# Patient Record
Sex: Male | Born: 1960 | Race: White | Hispanic: No | Marital: Married | Smoking: Never smoker
Health system: Southern US, Community
[De-identification: ages and names within clinical notes are randomized; demographics above are authoritative.]

## PROBLEM LIST (undated history)

## (undated) ENCOUNTER — Emergency Department: Discharge status: Absent without leave | Payer: 59

## (undated) DIAGNOSIS — K644 Residual hemorrhoidal skin tags: Secondary | ICD-10-CM

## (undated) DIAGNOSIS — K5790 Diverticulosis of intestine, part unspecified, without perforation or abscess without bleeding: Secondary | ICD-10-CM

## (undated) DIAGNOSIS — F909 Attention-deficit hyperactivity disorder, unspecified type: Secondary | ICD-10-CM

## (undated) DIAGNOSIS — D126 Benign neoplasm of colon, unspecified: Secondary | ICD-10-CM

## (undated) DIAGNOSIS — C801 Malignant (primary) neoplasm, unspecified: Secondary | ICD-10-CM

## (undated) DIAGNOSIS — Z85831 Personal history of malignant neoplasm of soft tissue: Secondary | ICD-10-CM

## (undated) DIAGNOSIS — H8109 Meniere's disease, unspecified ear: Secondary | ICD-10-CM

## (undated) HISTORY — PX: OTHER SURGICAL HISTORY: SHX169

## (undated) HISTORY — PX: COLONOSCOPY: SHX174

---

## 2008-09-10 ENCOUNTER — Emergency Department: Payer: Self-pay | Admitting: Emergency Medicine

## 2008-09-10 ENCOUNTER — Other Ambulatory Visit: Payer: Self-pay

## 2008-11-22 ENCOUNTER — Ambulatory Visit: Payer: Self-pay

## 2011-10-12 ENCOUNTER — Ambulatory Visit: Payer: Self-pay | Admitting: Unknown Physician Specialty

## 2015-10-10 ENCOUNTER — Other Ambulatory Visit: Payer: Self-pay | Admitting: Unknown Physician Specialty

## 2015-10-10 DIAGNOSIS — H905 Unspecified sensorineural hearing loss: Secondary | ICD-10-CM

## 2015-10-23 ENCOUNTER — Ambulatory Visit: Payer: Self-pay

## 2016-02-01 ENCOUNTER — Other Ambulatory Visit: Payer: Self-pay | Admitting: Otolaryngology

## 2016-02-01 DIAGNOSIS — IMO0001 Reserved for inherently not codable concepts without codable children: Secondary | ICD-10-CM

## 2016-02-01 DIAGNOSIS — H9041 Sensorineural hearing loss, unilateral, right ear, with unrestricted hearing on the contralateral side: Secondary | ICD-10-CM

## 2016-02-07 ENCOUNTER — Ambulatory Visit
Admission: RE | Admit: 2016-02-07 | Discharge: 2016-02-07 | Disposition: A | Discharge status: Home / Self Care | Payer: 59 | Source: Ambulatory Visit | Attending: Otolaryngology | Admitting: Otolaryngology

## 2016-02-07 DIAGNOSIS — H9041 Sensorineural hearing loss, unilateral, right ear, with unrestricted hearing on the contralateral side: Secondary | ICD-10-CM

## 2016-02-07 DIAGNOSIS — IMO0001 Reserved for inherently not codable concepts without codable children: Secondary | ICD-10-CM

## 2016-02-07 MED ORDER — GADOBENATE DIMEGLUMINE 529 MG/ML IV SOLN
15.0000 mL | Freq: Once | INTRAVENOUS | Status: AC | PRN
  Administered 2016-02-07: 15 mL via INTRAVENOUS

## 2018-08-23 IMAGING — MR MR HEAD WO/W CM
11 series · 39 of 48 positions shown · IV contrast (multihance)
Comparison: 10/12/2011

CLINICAL DATA: Hearing loss on the right with occasional vertigo.

EXAM:
MRI HEAD WITHOUT AND WITH CONTRAST
TECHNIQUE: Multiplanar, multiecho pulse sequences of the brain and surrounding
structures were obtained without and with intravenous contrast.
CONTRAST:  15mL MULTIHANCE GADOBENATE DIMEGLUMINE 529 MG/ML IV SOLN

[Series 2: T1 · sagittal · 5.0mm · 0.45mm/px · 4 of 21 slices shown (1 of 3)]
[im 1/21]
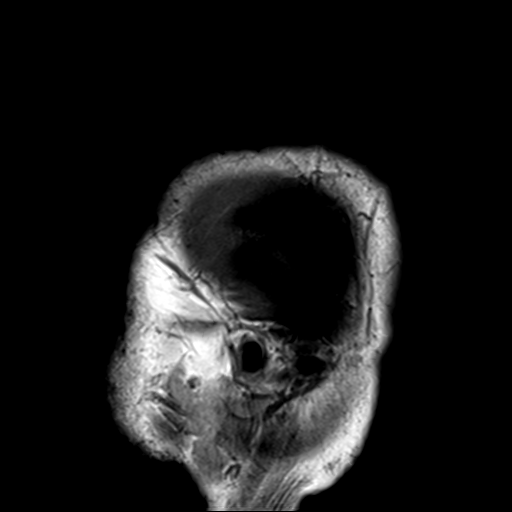
[im 7/21]
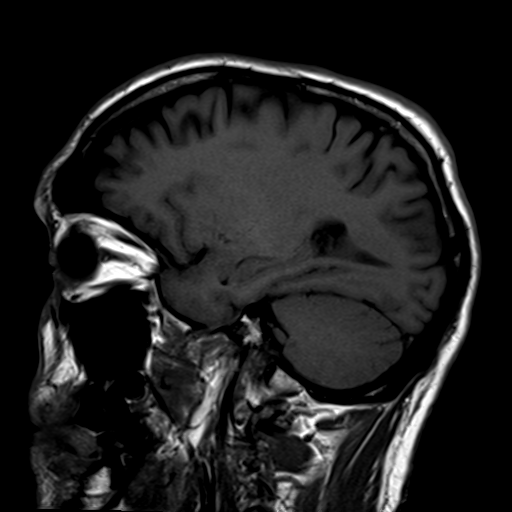
[im 14/21]
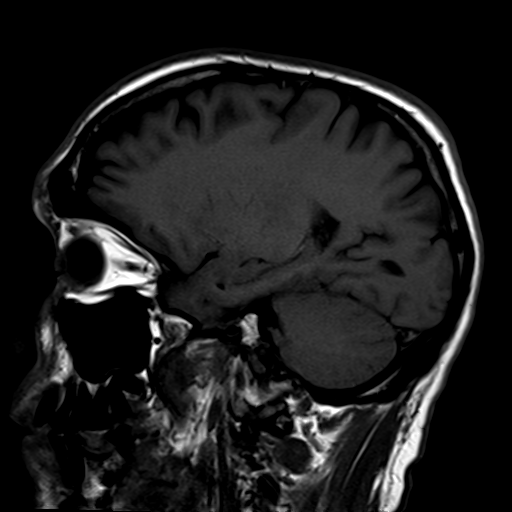
[im 21/21]
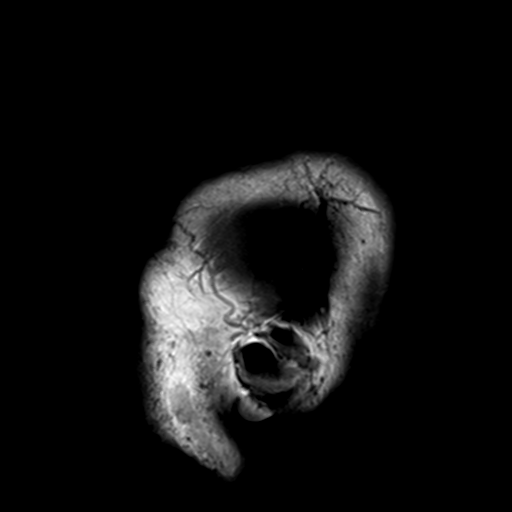

[Series 3: DWI · axial · 3.0mm · 1.80mm/px · z∈[-38,+106]mm · 8 of 100 slices shown (1 of 2)]
[im 1/100]
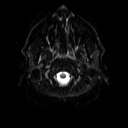
[im 16/100]
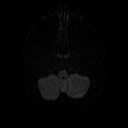
[im 31/100]
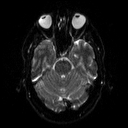
[im 46/100]
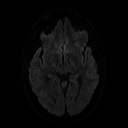
[im 54/100]
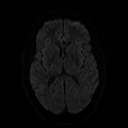
[im 69/100]
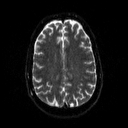
[im 84/100]
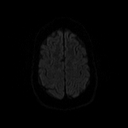
[im 100/100]
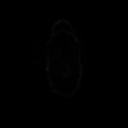

[Series 4: DWI · axial · 3.0mm · 1.80mm/px · z∈[-38,+106]mm · 6 of 47 slices shown (2 of 2)]
[im 1/47]
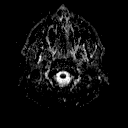
[im 10/47]
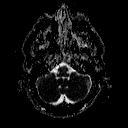
[im 19/47]
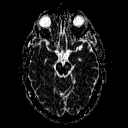
[im 28/47]
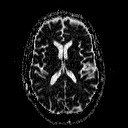
[im 37/47]
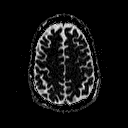
[im 47/47]
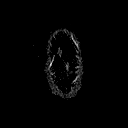

[Series 5: T2 · axial · 5.0mm · 0.60mm/px · z∈[-37,+104]mm · 3 of 23 slices shown]
[im 1/23]
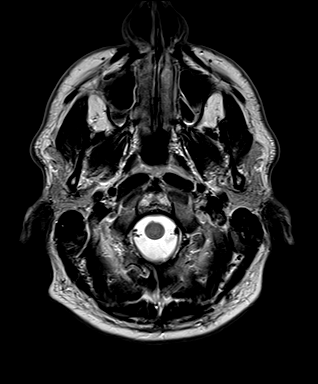
[im 12/23]
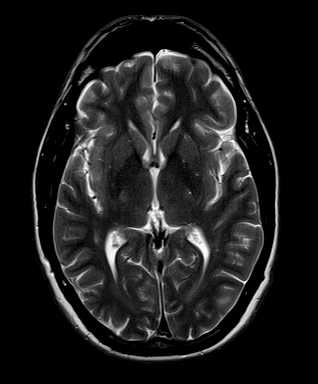
[im 23/23]
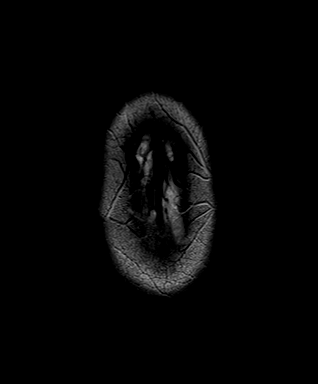

[Series 6: FLAIR · axial · 5.0mm · 0.45mm/px · z∈[-37,+103]mm · 3 of 23 slices shown]
[im 1/23]
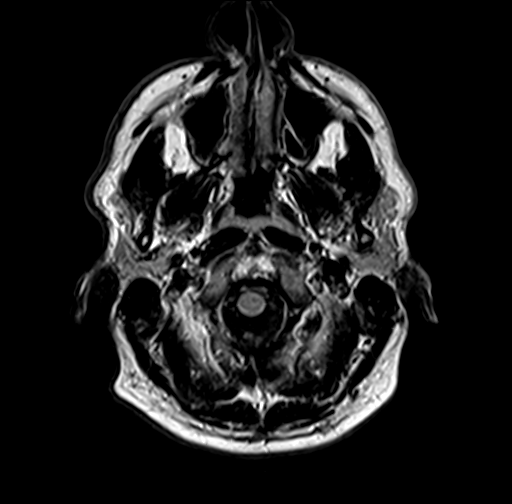
[im 12/23]
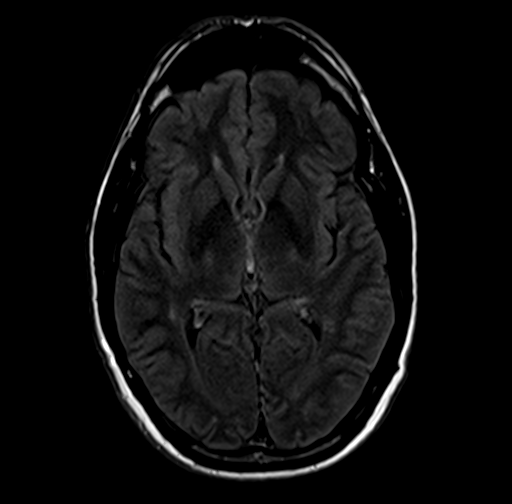
[im 23/23]
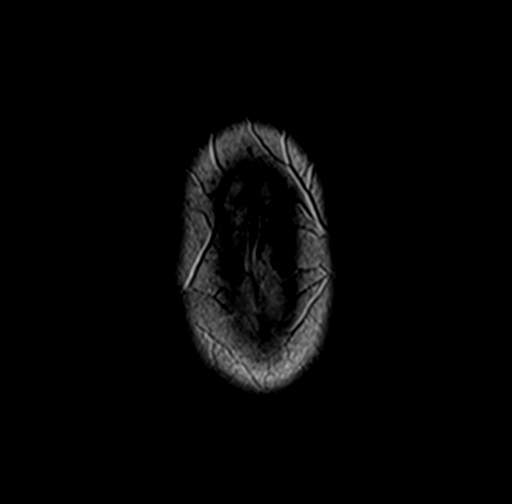

[Series 7: T1 · coronal · 3.0mm · 0.35mm/px · 1 of 11 slices shown (2 of 3)]
[im 1/11]
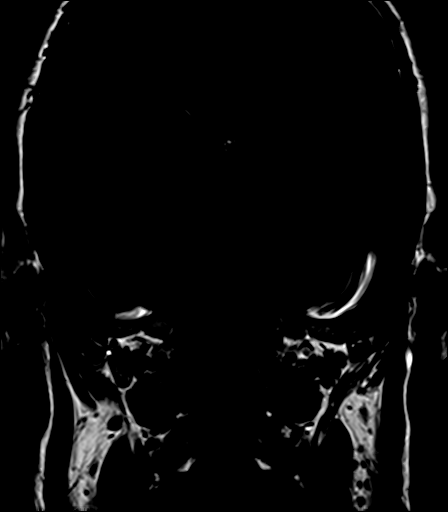

[Series 8: T1 · axial · 3.0mm · 0.35mm/px · 1 of 11 slices shown (3 of 3)]
[im 1/11]
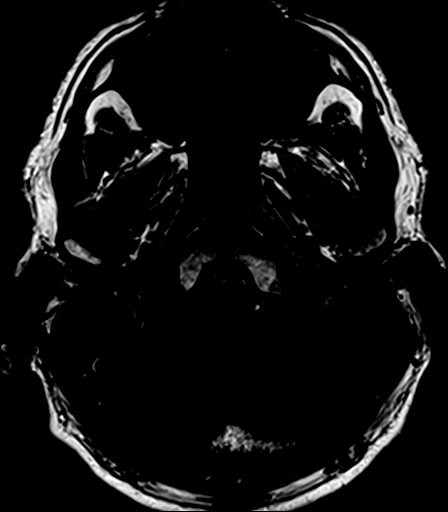

[Series 9: bSSFP · axial · 1.0mm · 0.56mm/px · z∈[-25,+10]mm · 5 of 36 slices shown]
[im 1/36]
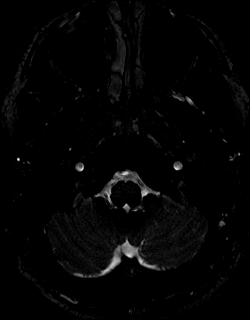
[im 9/36]
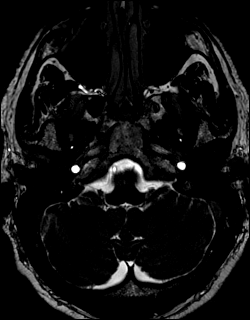
[im 18/36]
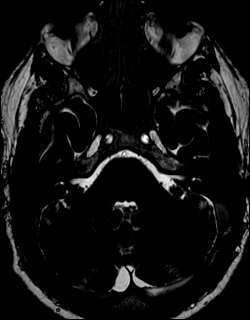
[im 27/36]
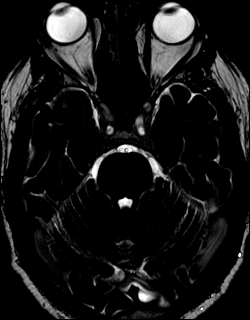
[im 36/36]
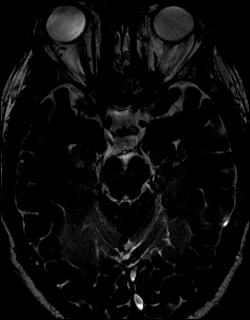

[Series 10: T1 post-contrast · coronal · 3.0mm · 0.35mm/px · 1 of 11 slices shown (1 of 2)]
[im 1/11]
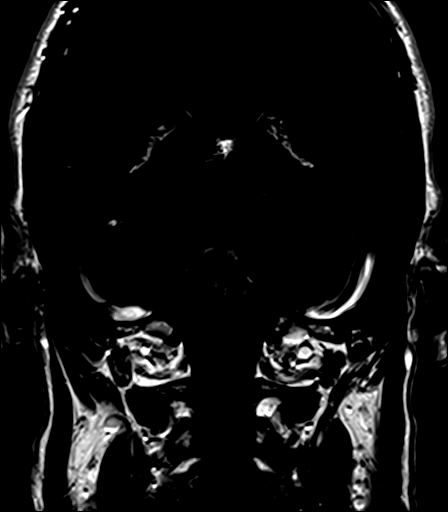

[Series 11: T1 post-contrast · axial · 3.0mm · 0.35mm/px · 1 of 11 slices shown (2 of 2)]
[im 1/11]
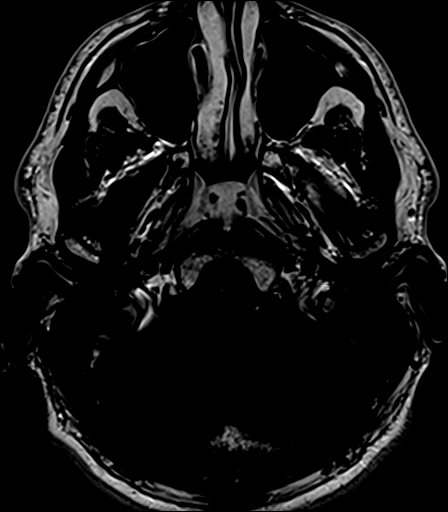

[Series 12: post_t1_mpr_tra · axial · 2.0mm · 0.45mm/px · z∈[-36,+68]mm · 6 of 72 slices shown]
[im 1/72]
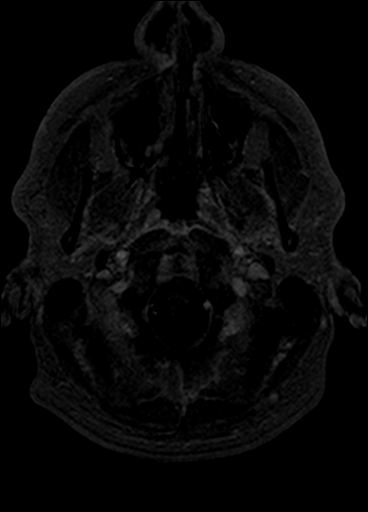
[im 9/72]
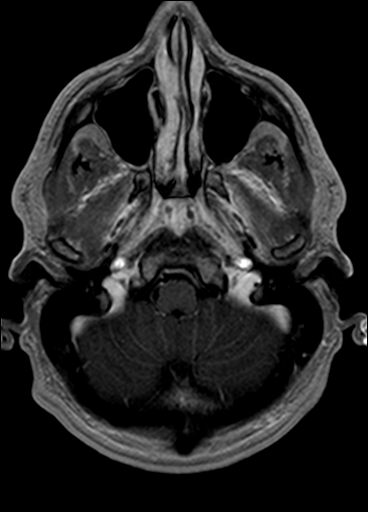
[im 18/72]
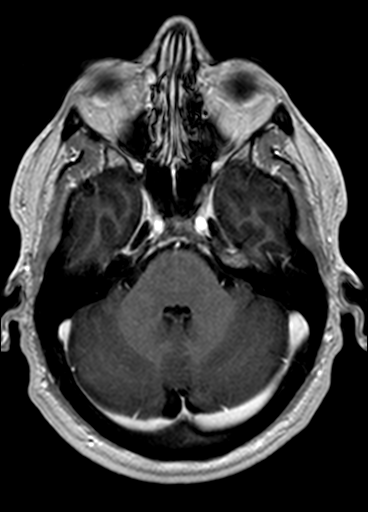
[im 27/72]
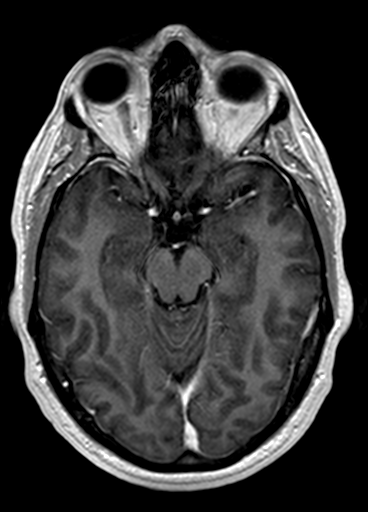
[im 45/72]
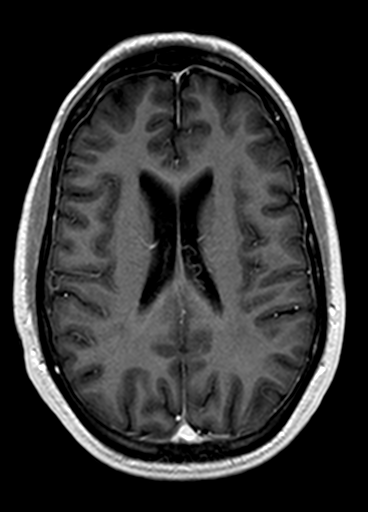
[im 54/72]
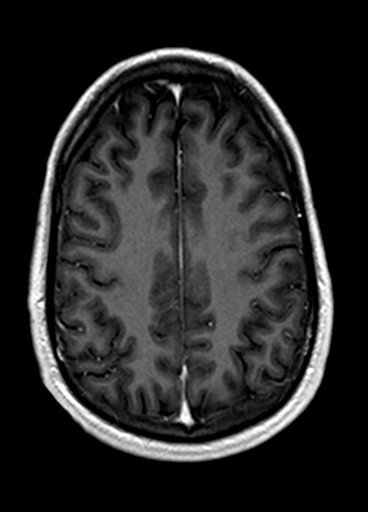

[39 of 48 positions shown; findings below may reference images not displayed]

FINDINGS: Brain: Diffusion imaging does not show any acute or subacute
infarction. The brainstem and cerebellum are normal. CP angle
regions are normal. Seventh and eighth nerve complexes are normal.
No vestibular schwannoma. No enhancing neuritis. Cerebral
hemispheres are normal except for a few scattered foci of T2 and
FLAIR signal in the white matter, not likely of clinical
significance. No cortical abnormality. No mass lesion, hemorrhage,
hydrocephalus or extra-axial collection. No abnormal contrast
enhancement.

Vascular: Major vessels at the base of the brain show flow.

Skull and upper cervical spine: Normal

Sinuses/Orbits: Clear/normal

Other: None significant
IMPRESSION: Negative examination. No cause of the presenting symptoms is
identified. No evidence of vestibular schwannoma or enhancing
neuritis.

Normal appearance of the brain for age.

## 2021-06-29 ENCOUNTER — Other Ambulatory Visit: Payer: Self-pay | Admitting: Internal Medicine

## 2021-07-04 LAB — SURGICAL PATHOLOGY

## 2021-09-28 ENCOUNTER — Other Ambulatory Visit: Payer: Self-pay | Admitting: Sports Medicine

## 2021-09-28 DIAGNOSIS — W11XXXA Fall on and from ladder, initial encounter: Secondary | ICD-10-CM

## 2021-10-12 ENCOUNTER — Ambulatory Visit
Admission: RE | Admit: 2021-10-12 | Discharge: 2021-10-12 | Disposition: A | Discharge status: Home / Self Care | Payer: Managed Care, Other (non HMO) | Source: Ambulatory Visit | Attending: Sports Medicine | Admitting: Sports Medicine

## 2021-10-12 DIAGNOSIS — W11XXXA Fall on and from ladder, initial encounter: Secondary | ICD-10-CM

## 2021-10-17 ENCOUNTER — Other Ambulatory Visit: Payer: Self-pay | Admitting: Orthopedic Surgery

## 2021-10-22 ENCOUNTER — Other Ambulatory Visit: Payer: Self-pay

## 2021-10-22 ENCOUNTER — Encounter
Admission: RE | Admit: 2021-10-22 | Discharge: 2021-10-22 | Disposition: A | Discharge status: Home / Self Care | Payer: Managed Care, Other (non HMO) | Source: Ambulatory Visit | Attending: Orthopedic Surgery | Admitting: Orthopedic Surgery

## 2021-10-22 HISTORY — DX: Meniere's disease, unspecified ear: H81.09

## 2021-10-22 HISTORY — DX: Personal history of malignant neoplasm of soft tissue: Z85.831

## 2021-10-22 HISTORY — DX: Diverticulosis of intestine, part unspecified, without perforation or abscess without bleeding: K57.90

## 2021-10-22 HISTORY — DX: Residual hemorrhoidal skin tags: K64.4

## 2021-10-22 HISTORY — DX: Attention-deficit hyperactivity disorder, unspecified type: F90.9

## 2021-10-22 HISTORY — DX: Malignant (primary) neoplasm, unspecified: C80.1

## 2021-10-22 HISTORY — DX: Benign neoplasm of colon, unspecified: D12.6

## 2021-10-22 NOTE — Pre-Procedure Instructions (Signed)
Patient not available, Wife Jesse Jennings assisted in pre admission call, instructions reviewed with her.

## 2021-10-22 NOTE — Patient Instructions (Signed)
Your procedure is scheduled on: 10/23/21 - Tuesday Report to the Registration Desk on the 1st floor of the [facility]. To find out your arrival time, please call [phone] between 1PM - 3PM on: 10/22/21 - Monday If your arrival time is 6:00 am, do not arrive prior to that time as the [facility] entrance doors do not open until 6:00 am.  REMEMBER: Instructions that are not followed completely may result in serious medical risk, up to and including death; or upon the discretion of your surgeon and anesthesiologist your surgery may need to be rescheduled.  Do not eat food after midnight the night before surgery.  No gum chewing, lozengers or hard candies.  You may however, drink CLEAR liquids up to 2 hours before you are scheduled to arrive for your surgery. Do not drink anything within 2 hours of your scheduled arrival time.  Clear liquids include: - water  - apple juice without pulp - gatorade (not RED colors) - black coffee or tea (Do NOT add milk or creamers to the coffee or tea) Do NOT drink anything that is not on this list.  TAKE THESE MEDICATIONS THE MORNING OF SURGERY WITH A SIP OF WATER: - NONE  One week prior to surgery: Stop Anti-inflammatories (NSAIDS) such as Advil, Aleve, Ibuprofen, Motrin, Naproxen, Naprosyn and Aspirin based products such as Excedrin, Goodys Powder, BC Powder.  Stop ANY OVER THE COUNTER supplements until after surgery.  You may however, continue to take Tylenol if needed for pain up until the day of surgery.  No Alcohol for 24 hours before or after surgery.  No Smoking including e-cigarettes for 24 hours prior to surgery.  No chewable tobacco products for at least 6 hours prior to surgery.  No nicotine patches on the day of surgery.  Do not use any "recreational" drugs for at least a week prior to your surgery.  Please be advised that the combination of cocaine and anesthesia may have negative outcomes, up to and including death. If you  test positive for cocaine, your surgery will be cancelled.  On the morning of surgery brush your teeth with toothpaste and water, you may rinse your mouth with mouthwash if you wish. Do not swallow any toothpaste or mouthwash.  Use CHG Soap or wipes as directed on instruction sheet.  Do not wear jewelry, make-up, hairpins, clips or nail polish.  Do not wear lotions, powders, or perfumes.   Do not shave body from the neck down 48 hours prior to surgery just in case you cut yourself which could leave a site for infection.  Also, freshly shaved skin may become irritated if using the CHG soap.  Contact lenses, hearing aids and dentures may not be worn into surgery.  Do not bring valuables to the hospital. [facility] is not responsible for any missing/lost belongings or valuables.   Notify your doctor if there is any change in your medical condition (cold, fever, infection).  Wear comfortable clothing (specific to your surgery type) to the hospital.  After surgery, you can help prevent lung complications by doing breathing exercises.  Take deep breaths and cough every 1-2 hours. Your doctor may order a device called an Incentive Spirometer to help you take deep breaths. When coughing or sneezing, hold a pillow firmly against your incision with both hands. This is called "splinting." Doing this helps protect your incision. It also decreases belly discomfort.  If you are being admitted to the hospital overnight, leave your suitcase in the  car. After surgery it may be brought to your room.  If you are being discharged the day of surgery, you will not be allowed to drive home. You will need a responsible adult (18 years or older) to drive you home and stay with you that night.   If you are taking public transportation, you will need to have a responsible adult (18 years or older) with you. Please confirm with your physician that it is acceptable to use public transportation.   Please call  the [facility]. at [phone] if you have any questions about these instructions.  Surgery Visitation Policy:  Patients undergoing a surgery or procedure may have two family members or support persons with them as long as the person is not COVID-19 positive or experiencing its symptoms.   Inpatient Visitation:    Visiting hours are 7 a.m. to 8 p.m. Up to four visitors are allowed at one time in a patient room, including children. The visitors may rotate out with other people during the day. One designated support person (adult) may remain overnight.

## 2021-10-23 ENCOUNTER — Encounter
Admission: RE | Disposition: A | Discharge status: Home / Self Care | Payer: Self-pay | Source: Home / Self Care | Attending: Orthopedic Surgery

## 2021-10-23 ENCOUNTER — Other Ambulatory Visit: Payer: Self-pay

## 2021-10-23 ENCOUNTER — Ambulatory Visit: Payer: Worker's Compensation | Admitting: Certified Registered Nurse Anesthetist

## 2021-10-23 ENCOUNTER — Ambulatory Visit: Payer: Worker's Compensation | Admitting: Urgent Care

## 2021-10-23 ENCOUNTER — Ambulatory Visit
Admission: RE | Admit: 2021-10-23 | Discharge: 2021-10-23 | Disposition: A | Discharge status: Home / Self Care | Payer: Worker's Compensation | Attending: Orthopedic Surgery | Admitting: Orthopedic Surgery

## 2021-10-23 ENCOUNTER — Ambulatory Visit: Payer: Worker's Compensation

## 2021-10-23 ENCOUNTER — Encounter: Payer: Self-pay | Admitting: Orthopedic Surgery

## 2021-10-23 DIAGNOSIS — S43432A Superior glenoid labrum lesion of left shoulder, initial encounter: Secondary | ICD-10-CM | POA: Diagnosis not present

## 2021-10-23 DIAGNOSIS — M25812 Other specified joint disorders, left shoulder: Secondary | ICD-10-CM | POA: Insufficient documentation

## 2021-10-23 DIAGNOSIS — M7522 Bicipital tendinitis, left shoulder: Secondary | ICD-10-CM | POA: Diagnosis not present

## 2021-10-23 DIAGNOSIS — M75122 Complete rotator cuff tear or rupture of left shoulder, not specified as traumatic: Secondary | ICD-10-CM | POA: Diagnosis not present

## 2021-10-23 DIAGNOSIS — W11XXXA Fall on and from ladder, initial encounter: Secondary | ICD-10-CM | POA: Diagnosis not present

## 2021-10-23 DIAGNOSIS — Z87891 Personal history of nicotine dependence: Secondary | ICD-10-CM | POA: Insufficient documentation

## 2021-10-23 HISTORY — PX: SHOULDER ARTHROSCOPY WITH SUBACROMIAL DECOMPRESSION AND OPEN ROTATOR C: SHX5688

## 2021-10-23 SURGERY — SHOULDER ARTHROSCOPY WITH SUBACROMIAL DECOMPRESSION AND OPEN ROTATOR CUFF REPAIR, OPEN BICEPS TENDON REPAIR
Anesthesia: General | Laterality: Left

## 2021-10-23 MED ORDER — FENTANYL CITRATE PF 50 MCG/ML IJ SOSY: 50.0000 ug | PREFILLED_SYRINGE | Freq: Once | INTRAMUSCULAR | Status: AC

## 2021-10-23 MED ORDER — FENTANYL CITRATE (PF) 100 MCG/2ML IJ SOLN
INTRAMUSCULAR | Status: AC
  Filled 2021-10-23: qty 2

## 2021-10-23 MED ORDER — FAMOTIDINE 20 MG PO TABS
ORAL_TABLET | ORAL | Status: AC
  Administered 2021-10-23: 20 mg via ORAL
  Filled 2021-10-23: qty 1

## 2021-10-23 MED ORDER — ONDANSETRON 4 MG PO TBDP: 4.0000 mg | ORAL_TABLET | Freq: Three times a day (TID) | ORAL | 0 refills | Status: AC | PRN

## 2021-10-23 MED ORDER — CEFAZOLIN SODIUM-DEXTROSE 2-4 GM/100ML-% IV SOLN
INTRAVENOUS | Status: AC
  Filled 2021-10-23: qty 100

## 2021-10-23 MED ORDER — OXYCODONE HCL 5 MG/5ML PO SOLN: 5.0000 mg | Freq: Once | ORAL | Status: DC | PRN

## 2021-10-23 MED ORDER — PHENYLEPHRINE HCL-NACL 20-0.9 MG/250ML-% IV SOLN
INTRAVENOUS | Status: DC | PRN
  Administered 2021-10-23: 20 ug/min via INTRAVENOUS

## 2021-10-23 MED ORDER — LIDOCAINE HCL (CARDIAC) PF 100 MG/5ML IV SOSY
PREFILLED_SYRINGE | INTRAVENOUS | Status: DC | PRN
  Administered 2021-10-23: 20 mg via INTRAVENOUS

## 2021-10-23 MED ORDER — ORAL CARE MOUTH RINSE: 15.0000 mL | Freq: Once | OROMUCOSAL | Status: AC

## 2021-10-23 MED ORDER — ROCURONIUM BROMIDE 100 MG/10ML IV SOLN
INTRAVENOUS | Status: DC | PRN
  Administered 2021-10-23 (×2): 10 mg via INTRAVENOUS
  Administered 2021-10-23: 60 mg via INTRAVENOUS

## 2021-10-23 MED ORDER — ACETAMINOPHEN 500 MG PO TABS: 1000.0000 mg | ORAL_TABLET | Freq: Three times a day (TID) | ORAL | 2 refills | Status: AC

## 2021-10-23 MED ORDER — ACETAMINOPHEN 10 MG/ML IV SOLN
INTRAVENOUS | Status: AC
  Filled 2021-10-23: qty 100

## 2021-10-23 MED ORDER — FENTANYL CITRATE (PF) 100 MCG/2ML IJ SOLN: 25.0000 ug | INTRAMUSCULAR | Status: DC | PRN

## 2021-10-23 MED ORDER — LACTATED RINGERS IR SOLN
Status: DC | PRN
  Administered 2021-10-23: 12000 mL

## 2021-10-23 MED ORDER — SUGAMMADEX SODIUM 200 MG/2ML IV SOLN
INTRAVENOUS | Status: DC | PRN
  Administered 2021-10-23: 200 mg via INTRAVENOUS

## 2021-10-23 MED ORDER — CHLORHEXIDINE GLUCONATE 0.12 % MT SOLN
OROMUCOSAL | Status: AC
  Administered 2021-10-23: 15 mL via OROMUCOSAL
  Filled 2021-10-23: qty 15

## 2021-10-23 MED ORDER — MIDAZOLAM HCL 2 MG/2ML IJ SOLN
INTRAMUSCULAR | Status: AC
  Filled 2021-10-23: qty 2

## 2021-10-23 MED ORDER — BUPIVACAINE LIPOSOME 1.3 % IJ SUSP
INTRAMUSCULAR | Status: DC | PRN
  Administered 2021-10-23: 20 mL

## 2021-10-23 MED ORDER — CHLORHEXIDINE GLUCONATE 0.12 % MT SOLN: 15.0000 mL | Freq: Once | OROMUCOSAL | Status: AC

## 2021-10-23 MED ORDER — EPINEPHRINE PF 1 MG/ML IJ SOLN
INTRAMUSCULAR | Status: AC
  Filled 2021-10-23: qty 4

## 2021-10-23 MED ORDER — BUPIVACAINE HCL (PF) 0.5 % IJ SOLN
INTRAMUSCULAR | Status: DC | PRN
  Administered 2021-10-23: 10 mL

## 2021-10-23 MED ORDER — FENTANYL CITRATE PF 50 MCG/ML IJ SOSY
PREFILLED_SYRINGE | INTRAMUSCULAR | Status: AC
  Administered 2021-10-23: 50 ug via INTRAVENOUS
  Filled 2021-10-23: qty 1

## 2021-10-23 MED ORDER — FAMOTIDINE 20 MG PO TABS: 20.0000 mg | ORAL_TABLET | Freq: Once | ORAL | Status: AC

## 2021-10-23 MED ORDER — DEXAMETHASONE SODIUM PHOSPHATE 10 MG/ML IJ SOLN
INTRAMUSCULAR | Status: DC | PRN
  Administered 2021-10-23: 8 mg via INTRAVENOUS

## 2021-10-23 MED ORDER — ASPIRIN 325 MG PO TBEC: 325.0000 mg | DELAYED_RELEASE_TABLET | Freq: Every day | ORAL | 0 refills | Status: AC

## 2021-10-23 MED ORDER — BUPIVACAINE HCL (PF) 0.5 % IJ SOLN
INTRAMUSCULAR | Status: AC
  Filled 2021-10-23: qty 10

## 2021-10-23 MED ORDER — BUPIVACAINE LIPOSOME 1.3 % IJ SUSP
INTRAMUSCULAR | Status: AC
  Filled 2021-10-23: qty 20

## 2021-10-23 MED ORDER — ONDANSETRON HCL 4 MG/2ML IJ SOLN
INTRAMUSCULAR | Status: DC | PRN
  Administered 2021-10-23: 4 mg via INTRAVENOUS

## 2021-10-23 MED ORDER — FENTANYL CITRATE (PF) 100 MCG/2ML IJ SOLN
INTRAMUSCULAR | Status: DC | PRN
  Administered 2021-10-23 (×3): 50 ug via INTRAVENOUS

## 2021-10-23 MED ORDER — RINGERS IRRIGATION IR SOLN
Status: DC | PRN
  Administered 2021-10-23: 3000 mL

## 2021-10-23 MED ORDER — GLYCOPYRROLATE 0.2 MG/ML IJ SOLN
INTRAMUSCULAR | Status: DC | PRN
  Administered 2021-10-23 (×2): .1 mg via INTRAVENOUS

## 2021-10-23 MED ORDER — BUPIVACAINE HCL (PF) 0.5 % IJ SOLN
INTRAMUSCULAR | Status: AC
  Filled 2021-10-23: qty 30

## 2021-10-23 MED ORDER — PHENYLEPHRINE HCL (PRESSORS) 10 MG/ML IV SOLN
INTRAVENOUS | Status: DC | PRN
  Administered 2021-10-23 (×2): 80 ug via INTRAVENOUS

## 2021-10-23 MED ORDER — LACTATED RINGERS IV SOLN: INTRAVENOUS | Status: DC

## 2021-10-23 MED ORDER — OXYCODONE HCL 5 MG PO TABS: 5.0000 mg | ORAL_TABLET | Freq: Once | ORAL | Status: DC | PRN

## 2021-10-23 MED ORDER — ACETAMINOPHEN 10 MG/ML IV SOLN
INTRAVENOUS | Status: DC | PRN
  Administered 2021-10-23: 1000 mg via INTRAVENOUS

## 2021-10-23 MED ORDER — MIDAZOLAM HCL 2 MG/2ML IJ SOLN
INTRAMUSCULAR | Status: DC | PRN
  Administered 2021-10-23: 2 mg via INTRAVENOUS

## 2021-10-23 MED ORDER — CEFAZOLIN SODIUM-DEXTROSE 2-4 GM/100ML-% IV SOLN
2.0000 g | INTRAVENOUS | Status: AC
  Administered 2021-10-23: 2 g via INTRAVENOUS

## 2021-10-23 MED ORDER — OXYCODONE HCL 5 MG PO TABS: 5.0000 mg | ORAL_TABLET | ORAL | 0 refills | Status: AC | PRN

## 2021-10-23 MED ORDER — PROPOFOL 10 MG/ML IV BOLUS
INTRAVENOUS | Status: DC | PRN
  Administered 2021-10-23: 150 mg via INTRAVENOUS
  Administered 2021-10-23: 50 mg via INTRAVENOUS

## 2021-10-23 SURGICAL SUPPLY — 78 items
ADAPTER IRRIG TUBE 2 SPIKE SOL (ADAPTER) ×2 IMPLANT
ADH SKN CLS APL DERMABOND .7 (GAUZE/BANDAGES/DRESSINGS) ×1
ADPR TBG 2 SPK PMP STRL ASCP (ADAPTER) ×2
ANCH SUT 2 SWLK 19.1 CLS EYLT (Anchor) ×2 IMPLANT
ANCH SUT 2.9 PUSHLOCK ANCH (Orthopedic Implant) ×1 IMPLANT
ANCH SUT 2X2.3 2 STRN TPE (Anchor) ×1 IMPLANT
ANCH SUT 5 3.9 CRKSW KNTLS (Anchor) ×1 IMPLANT
ANCH SUT SWLK 19.1X4.75 (Anchor) ×1 IMPLANT
ANCHOR 3.9 PEEK CORKSCREW 5MTS (Anchor) IMPLANT
ANCHOR ICONIX SPEED 2.0 TAPE (Anchor) IMPLANT
ANCHOR SUT BIO SW 4.75X19.1 (Anchor) IMPLANT
ANCHOR SWIVELOCK BIO 4.75X19.1 (Anchor) ×1 IMPLANT
APL PRP STRL LF DISP 70% ISPRP (MISCELLANEOUS) ×1
BLADE SHAVER 4.5X7 STR FR (MISCELLANEOUS) ×1 IMPLANT
BNDG ADH 2 X3.75 FABRIC TAN LF (GAUZE/BANDAGES/DRESSINGS) ×1 IMPLANT
BNDG ADH XL 3.75X2 STRCH LF (GAUZE/BANDAGES/DRESSINGS) ×1
BUR BR 5.5 WIDE MOUTH (BURR) IMPLANT
CANNULA TWIST IN 8.25X9CM (CANNULA) IMPLANT
CHLORAPREP W/TINT 26 (MISCELLANEOUS) ×1 IMPLANT
COOLER POLAR GLACIER W/PUMP (MISCELLANEOUS) ×1 IMPLANT
DERMABOND ADVANCED .7 DNX12 (GAUZE/BANDAGES/DRESSINGS) IMPLANT
DRAPE 3/4 80X56 (DRAPES) ×1 IMPLANT
DRAPE INCISE IOBAN 66X45 STRL (DRAPES) ×1 IMPLANT
DRAPE U-SHAPE 47X51 STRL (DRAPES) ×2 IMPLANT
DRSG TEGADERM 4X4.75 (GAUZE/BANDAGES/DRESSINGS) ×3 IMPLANT
ELECT REM PT RETURN 9FT ADLT (ELECTROSURGICAL) ×1
ELECTRODE REM PT RTRN 9FT ADLT (ELECTROSURGICAL) ×1 IMPLANT
GAUZE SPONGE 4X4 12PLY STRL (GAUZE/BANDAGES/DRESSINGS) ×1 IMPLANT
GAUZE XEROFORM 1X8 LF (GAUZE/BANDAGES/DRESSINGS) ×1 IMPLANT
GLOVE BIO SURGEON STRL SZ7.5 (GLOVE) ×1 IMPLANT
GLOVE BIOGEL PI IND STRL 8 (GLOVE) ×2 IMPLANT
GLOVE SURG ORTHO 8.0 STRL STRW (GLOVE) ×1 IMPLANT
GLOVE SURG SYN 8.0 (GLOVE) ×1 IMPLANT
GLOVE SURG SYN 8.0 PF PI (GLOVE) ×1 IMPLANT
GOWN STRL REUS W/ TWL LRG LVL3 (GOWN DISPOSABLE) ×2 IMPLANT
GOWN STRL REUS W/TWL LRG LVL3 (GOWN DISPOSABLE) ×2
GOWN STRL REUS W/TWL XL LVL4 (GOWN DISPOSABLE) ×1 IMPLANT
IV LACTATED RINGER IRRG 3000ML (IV SOLUTION) ×5
IV LR IRRIG 3000ML ARTHROMATIC (IV SOLUTION) ×4 IMPLANT
KIT CORKSCREW KNTLS 3.9 S/T/P (INSTRUMENTS) IMPLANT
KIT STABILIZATION SHOULDER (MISCELLANEOUS) ×1 IMPLANT
KIT TURNOVER KIT A (KITS) ×1 IMPLANT
MANIFOLD NEPTUNE II (INSTRUMENTS) ×2 IMPLANT
MASK FACE SPIDER DISP (MASK) ×1 IMPLANT
MAT ABSORB  FLUID 56X50 GRAY (MISCELLANEOUS) ×2
MAT ABSORB FLUID 56X50 GRAY (MISCELLANEOUS) ×2 IMPLANT
NDL MAYO 6 CRC TAPER PT (NEEDLE) IMPLANT
NDL MAYO CATGUT SZ5 (NEEDLE)
NDL SAFETY ECLIP 18X1.5 (MISCELLANEOUS) ×1 IMPLANT
NDL SCORPION MULTI FIRE (NEEDLE) IMPLANT
NDL SUT 5 .5 CRC TPR PNT MAYO (NEEDLE) IMPLANT
NEEDLE MAYO 6 CRC TAPER PT (NEEDLE) ×1 IMPLANT
NEEDLE SCORPION MULTI FIRE (NEEDLE) IMPLANT
PACK ARTHROSCOPY SHOULDER (MISCELLANEOUS) ×1 IMPLANT
PAD ARMBOARD 7.5X6 YLW CONV (MISCELLANEOUS) ×1 IMPLANT
PAD WRAPON POLAR SHDR XLG (MISCELLANEOUS) ×1 IMPLANT
PASSER SUT FIRSTPASS SELF (INSTRUMENTS) ×1 IMPLANT
PASSER SUT SWIFTSTITCH HIP CRT (INSTRUMENTS) ×1 IMPLANT
SHAVER BLADE BONE CUTTER  5.5 (BLADE) ×1
SHAVER BLADE BONE CUTTER 5.5 (BLADE) ×1 IMPLANT
SLING ULTRA II M (MISCELLANEOUS) ×1 IMPLANT
SPONGE T-LAP 18X18 ~~LOC~~+RFID (SPONGE) ×1 IMPLANT
STRAP SAFETY 5IN WIDE (MISCELLANEOUS) ×1 IMPLANT
SUT ETHILON 3-0 (SUTURE) ×1 IMPLANT
SUT VIC AB 0 CT1 36 (SUTURE) IMPLANT
SUT VIC AB 2-0 CT2 27 (SUTURE) IMPLANT
SUTURE TAPE 1.3 40 TPR END (SUTURE) IMPLANT
SUTURETAPE 1.3 40 TPR END (SUTURE) ×2
SYSTEM IMPL TENODESIS LNT 2.9 (Orthopedic Implant) IMPLANT
TAPE CLOTH 3X10 WHT NS LF (GAUZE/BANDAGES/DRESSINGS) ×1 IMPLANT
TAPE MICROFOAM 4IN (TAPE) ×1 IMPLANT
TRAP FLUID SMOKE EVACUATOR (MISCELLANEOUS) ×1 IMPLANT
TUBING CONNECTING 10 (TUBING) IMPLANT
TUBING INFLOW SET DBFLO PUMP (TUBING) ×1 IMPLANT
TUBING OUTFLOW SET DBLFO PUMP (TUBING) ×1 IMPLANT
WAND WEREWOLF FLOW 90D (MISCELLANEOUS) ×1 IMPLANT
WATER STERILE IRR 500ML POUR (IV SOLUTION) ×1 IMPLANT
WRAPON POLAR PAD SHDR XLG (MISCELLANEOUS) ×1

## 2021-10-23 NOTE — Transfer of Care (Signed)
Immediate Anesthesia Transfer of Care Note  Patient: Jesse Jennings  Procedure(s) Performed: Left shoulder arthroscopic vs mini-open rotator cuff repair, biceps tenodesis, distal clavicle excision, subacromial decompression (Left)  Patient Location: PACU  Anesthesia Type:GA combined with regional for post-op pain  Level of Consciousness: awake, oriented and patient cooperative  Airway & Oxygen Therapy: Patient Spontanous Breathing  Post-op Assessment: Report given to RN and Post -op Vital signs reviewed and stable  Post vital signs: Reviewed and stable  Last Vitals:  Vitals Value Taken Time  BP 141/93 10/23/21 1608  Temp 36.1 C 10/23/21 1608  Pulse 79 10/23/21 1611  Resp 14 10/23/21 1611  SpO2 96 % 10/23/21 1611  Vitals shown include unvalidated device data.  Last Pain:  Vitals:   10/23/21 1113  TempSrc: Temporal  PainSc: 3          Complications: No notable events documented.

## 2021-10-23 NOTE — Anesthesia Procedure Notes (Signed)
Procedure Name: Intubation Date/Time: 10/23/2021 1:56 PM  Performed by: Lowry Bowl, CRNAPre-anesthesia Checklist: Patient identified, Emergency Drugs available, Suction available and Patient being monitored Patient Re-evaluated:Patient Re-evaluated prior to induction Oxygen Delivery Method: Circle system utilized Preoxygenation: Pre-oxygenation with 100% oxygen Induction Type: IV induction Ventilation: Mask ventilation without difficulty Laryngoscope Size: McGraph and 4 Grade View: Grade I Tube type: Oral Tube size: 7.0 mm Number of attempts: 1 Airway Equipment and Method: Stylet and Video-laryngoscopy Placement Confirmation: ETT inserted through vocal cords under direct vision, positive ETCO2 and breath sounds checked- equal and bilateral Secured at: 22 cm Tube secured with: Tape Dental Injury: Teeth and Oropharynx as per pre-operative assessment

## 2021-10-23 NOTE — Anesthesia Preprocedure Evaluation (Signed)
Anesthesia Evaluation  Patient identified by MRN, date of birth, ID band Patient awake    Reviewed: Allergy & Precautions, NPO status , Patient's Chart, lab work & pertinent test results  History of Anesthesia Complications Negative for: history of anesthetic complications  Airway Mallampati: III  TM Distance: >3 FB Neck ROM: full    Dental  (+) Dental Advidsory Given, Chipped   Pulmonary neg pulmonary ROS, neg shortness of breath, neg COPD,    Pulmonary exam normal        Cardiovascular (-) angina(-) Past MI and (-) CABG negative cardio ROS Normal cardiovascular exam     Neuro/Psych PSYCHIATRIC DISORDERS negative neurological ROS     GI/Hepatic negative GI ROS, Neg liver ROS,   Endo/Other  negative endocrine ROS  Renal/GU      Musculoskeletal   Abdominal   Peds  Hematology negative hematology ROS (+)   Anesthesia Other Findings Past Medical History: No date: ADHD (attention deficit hyperactivity disorder) No date: 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 No date: Diverticulosis No date: External hemorrhoids 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 No date: Meniere's disease No date: Tubular adenoma of colon  Past Surgical History: No date: cancer removed; Right     Comment:  shoulder No date: COLONOSCOPY  BMI    Body Mass Index: 26.34 kg/m      Reproductive/Obstetrics negative OB ROS                             Anesthesia Physical Anesthesia Plan  ASA: 2  Anesthesia Plan: General ETT   Post-op Pain Management: Regional block*   Induction: Intravenous  PONV Risk Score and Plan: 2 and Ondansetron, Dexamethasone, Midazolam and Treatment may vary due to age or medical condition  Airway Management Planned: Oral ETT  Additional Equipment:   Intra-op Plan:   Post-operative Plan: Extubation in OR  Informed Consent: I have reviewed the patients History and Physical, chart, labs  and discussed the procedure including the risks, benefits and alternatives for the proposed anesthesia with the patient or authorized representative who has indicated his/her understanding and acceptance.     Dental Advisory Given  Plan Discussed with: Anesthesiologist, CRNA and Surgeon  Anesthesia Plan Comments: (Patient consented for risks of anesthesia including but not limited to:  - adverse reactions to medications - damage to eyes, teeth, lips or other oral mucosa - nerve damage due to positioning  - sore throat or hoarseness - Damage to heart, brain, nerves, lungs, other parts of body or loss of life  Patient voiced understanding.)        Anesthesia Quick Evaluation

## 2021-10-23 NOTE — Discharge Instructions (Addendum)
Post-Op Instructions - Rotator Cuff Repair  1. Bracing: You will wear a shoulder immobilizer or sling for 6 weeks.   2. Driving: No driving for 3 weeks post-op. When driving, do not wear the immobilizer. Ideally, we recommend no driving for 6 weeks while sling is in place as one arm will be immobilized.   3. Activity: No active lifting for 2 months. Wrist, hand, and elbow motion only. Avoid lifting the upper arm away from the body except for hygiene. You are permitted to bend and straighten the elbow passively only (no active elbow motion). You may use your hand and wrist for typing, writing, and managing utensils (cutting food). Do not lift more than a coffee cup for 8 weeks.  When sleeping or resting, inclined positions (recliner chair or wedge pillow) and a pillow under the forearm for support may provide better comfort for up to 4 weeks.  Avoid long distance travel for 4 weeks.  Return to normal activities after rotator cuff repair repair normally takes 6 months on average. If rehab goes very well, may be able to do most activities at 4 months, except overhead or contact sports.  4. Physical Therapy: Begins 3-4 days after surgery, and proceed 1 time per week for the first 6 weeks, then 1-2 times per week from weeks 6-20 post-op.  5. Medications:  - You will be provided a prescription for narcotic pain medicine. After surgery, take 1-2 narcotic tablets every 4 hours if needed for severe pain.  - A prescription for anti-nausea medication will be provided in case the narcotic medicine causes nausea - take 1 tablet every 6 hours only if nauseated.   - Take tylenol 1000 mg (2 Extra Strength tablets or 3 regular strength) every 8 hours for pain.  May decrease or stop tylenol 5 days after surgery if you are having minimal pain. - Take ASA 325mg/day x 2 weeks to help prevent DVTs/PEs (blood clots).  - DO NOT take ANY nonsteroidal anti-inflammatory pain medications (Advil, Motrin, Ibuprofen, Aleve,  Naproxen, or Naprosyn). These medicines can inhibit healing of your shoulder repair.    If you are taking prescription medication for anxiety, depression, insomnia, muscle spasm, chronic pain, or for attention deficit disorder, you are advised that you are at a higher risk of adverse effects with use of narcotics post-op, including narcotic addiction/dependence, depressed breathing, death. If you use non-prescribed substances: alcohol, marijuana, cocaine, heroin, methamphetamines, etc., you are at a higher risk of adverse effects with use of narcotics post-op, including narcotic addiction/dependence, depressed breathing, death. You are advised that taking > 50 morphine milligram equivalents (MME) of narcotic pain medication per day results in twice the risk of overdose or death. For your prescription provided: oxycodone 5 mg - taking more than 6 tablets per day would result in > 50 morphine milligram equivalents (MME) of narcotic pain medication. Be advised that we will prescribe narcotics short-term, for acute post-operative pain only - 3 weeks for major operations such as shoulder repair/reconstruction surgeries.     6. Post-Op Appointment:  Your first post-op appointment will be 10-14 days post-op.  7. Work or School: For most, but not all procedures, we advise staying out of work or school for at least 1 to 2 weeks in order to recover from the stress of surgery and to allow time for healing.   If you need a work or school note this can be provided.   8. Smoking: If you are a smoker, you need to refrain from   smoking in the postoperative period. The nicotine in cigarettes will inhibit healing of your shoulder repair and decrease the chance of successful repair. Similarly, nicotine containing products (gum, patches) should be avoided.   Post-operative Brace: Apply and remove the brace you received as you were instructed to at the time of fitting and as described in detail as the brace's  instructions for use indicate.  Wear the brace for the period of time prescribed by your physician.  The brace can be cleaned with soap and water and allowed to air dry only.  Should the brace result in increased pain, decreased feeling (numbness/tingling), increased swelling or an overall worsening of your medical condition, please contact your doctor immediately.  If an emergency situation occurs as a result of wearing the brace after normal business hours, please dial 911 and seek immediate medical attention.  Let your doctor know if you have any further questions about the brace issued to you. Refer to the shoulder sling instructions for use if you have any questions regarding the correct fit of your shoulder sling.  [place] for Troubleshooting: [phone]  Video that illustrates how to properly use a shoulder sling: "Instructions for Proper Use of an Orthopaedic Sling" [email or web address].hu  AMBULATORY SURGERY  DISCHARGE INSTRUCTIONS   The drugs that you were given will stay in your system until tomorrow so for the next 24 hours you should not:  Drive an automobile Make any legal decisions Drink any alcoholic beverage   You may resume regular meals tomorrow.  Today it is better to start with liquids and gradually work up to solid foods.  You may eat anything you prefer, but it is better to start with liquids, then soup and crackers, and gradually work up to solid foods.   Please notify your doctor immediately if you have any unusual bleeding, trouble breathing, redness and pain at the surgery site, drainage, fever, or pain not relieved by medication.    Additional Instructions:     Please contact your physician with any problems or Same Day Surgery at [phone], Monday through Friday 6 am to 4 pm, or  at [facility] number at [phone].      Interscalene Nerve Block with Exparel   For your surgery you have received an  Interscalene Nerve Block with Exparel. Nerve Blocks affect many types of nerves, including nerves that control movement, pain and normal sensation.  You may experience feelings such as numbness, tingling, heaviness, weakness or the inability to move your arm or the feeling or sensation that your arm has "fallen asleep". A nerve block with Exparel can last up to 5 days.  Usually the weakness wears off first.  The tingling and heaviness usually wear off next.  Finally you may start to notice pain.  Keep in mind that this may occur in any order.  Once a nerve block starts to wear off it is usually completely gone within 60 minutes. ISNB may cause mild shortness of breath, a hoarse voice, blurry vision, unequal pupils, or drooping of the face on the same side as the nerve block.  These symptoms will usually resolve with the numbness.  Very rarely the procedure itself can cause mild seizures. If needed, your surgeon will give you a prescription for pain medication.  It will take about 60 minutes for the oral pain medication to become fully effective.  So, it is recommended that you start taking this medication before the nerve block first begins to wear off, or  when you first begin to feel discomfort. Take your pain medication only as prescribed.  Pain medication can cause sedation and decrease your breathing if you take more than you need for the level of pain that you have. Nausea is a common side effect of many pain medications.  You may want to eat something before taking your pain medicine to prevent nausea. After an Interscalene nerve block, you cannot feel pain, pressure or extremes in temperature in the effected arm.  Because your arm is numb it is at an increased risk for injury.  To decrease the possibility of injury, please practice the following:  While you are awake change the position of your arm frequently to prevent too much pressure on any one area for prolonged periods of time.  If you have a  cast or tight dressing, check the color or your fingers every couple of hours.  Call your surgeon with the appearance of any discoloration (white or blue). If you are given a sling to wear before you go home, please wear it  at all times until the block has completely worn off.  Do not get up at night without your sling. Please contact [facility] or your surgeon if you do not begin to regain sensation after 7 days from the surgery.  Anesthesia may be contacted by calling the Same Day Surgery Department, Mon. through Fri., 6 am to 4 pm at [phone].   If you experience any other problems or concerns, please contact your surgeon's office. If you experience severe or prolonged shortness of breath go to the nearest emergency department.  POLAR CARE INFORMATION  [email or web address]  How to use [place] Cold Therapy System?  YouTube   BargainHeads.tn  OPERATING INSTRUCTIONS  Start the product With dry hands, connect the transformer to the electrical connection located on the top of the cooler. Next, plug the transformer into an appropriate electrical outlet. The unit will automatically start running at this point.  To stop the pump, disconnect electrical power.  Unplug to stop the product when not in use. Unplugging the Polar Care unit turns it off. Always unplug immediately after use. Never leave it plugged in while unattended. Remove pad.    FIRST ADD WATER TO FILL LINE, THEN ICE---Replace ice when existing ice is almost melted  1 Discuss Treatment with your [facility] Practitioner and Use Only as Prescribed 2 Apply Insulation Barrier & Cold Therapy Pad 3 Check for Moisture 4 Inspect Skin Regularly  Tips and Trouble Shooting Usage Tips 1. Use cubed or chunked ice for optimal performance. 2. It is recommended to drain the Pad between uses. To drain the pad, hold the Pad upright with the hose pointed toward the ground. Depress the black  plunger and allow water to drain out. 3. You may disconnect the Pad from the unit without removing the pad from the affected area by depressing the silver tabs on the hose coupling and gently pulling the hoses apart. The Pad and unit will seal itself and will not leak. Note: Some dripping during release is normal. 4. DO NOT RUN PUMP WITHOUT WATER! The pump in this unit is designed to run with water. Running the unit without water will cause permanent damage to the pump. 5. Unplug unit before removing lid.  TROUBLESHOOTING GUIDE Pump not running, Water not flowing to the pad, Pad is not getting cold 1. Make sure the transformer is plugged into the wall outlet. 2. Confirm that the ice and  water are filled to the indicated levels. 3. Make sure there are no kinks in the pad. 4. Gently pull on the blue tube to make sure the tube/pad junction is straight. 5. Remove the pad from the treatment site and ll it while the pad is lying at; then reapply. 6. Confirm that the pad couplings are securely attached to the unit. Listen for the double clicks (Figure 1) to confirm the pad couplings are securely attached.  Leaks    Note: Some condensation on the lines, controller, and pads is unavoidable, especially in warmer climates. 1. If using a [place] Polar Care Cold Therapy unit with a detachable Cold Therapy Pad, and a leak exists (other than condensation on the lines) disconnect the pad couplings. Make sure the silver tabs on the couplings are depressed before reconnecting the pad to the pump hose; then confirm both sides of the coupling are properly clicked in. 2. If the coupling continues to leak or a leak is detected in the pad itself, stop using it and call [facility] at [phone].  Cleaning After use, empty and dry the unit with a soft cloth. Warm water and mild detergent may be used occasionally to clean the pump and tubes.  WARNING: The [place] can be cold enough to cause serious  injury, including full skin necrosis. Follow these Operating Instructions, and carefully read the Product Insert (see pouch on side of unit) and the Cold Therapy Pad Fitting Instructions (provided with each Cold Therapy Pad) prior to use.    Received Tylenol 1,000 mg at 2:15 pm.

## 2021-10-23 NOTE — Anesthesia Procedure Notes (Signed)
Anesthesia Regional Block: Interscalene brachial plexus block   Pre-Anesthetic Checklist: , timeout performed,  Correct Patient, Correct Site, Correct Laterality,  Correct Procedure, Correct Position, site marked,  Risks and benefits discussed,  Surgical consent,  Pre-op evaluation,  At surgeon's request and post-op pain management  Laterality: Left  Prep: chloraprep       Needles:  Injection technique: Single-shot  Needle Type: Echogenic Needle     Needle Length: 4cm  Needle Gauge: 25     Additional Needles:   Procedures:,,,, ultrasound used (permanent image in chart),,    Narrative:  Start time: 10/23/2021 12:15 PM End time: 10/23/2021 12:20 PM Injection made incrementally with aspirations every 5 mL.  Performed by: Personally  Anesthesiologist: Dimas Millin, MD  Additional Notes: Patient's chart reviewed and they were deemed appropriate candidate for procedure, at surgeon's request. Patient educated about risks, benefits, and alternatives of the block including but not limited to: temporary or permanent nerve damage, bleeding, infection, damage to surround tissues, pneumothorax, hemidiaphragmatic paralysis, unilateral Horner's syndrome, block failure, local anesthetic toxicity. Patient expressed understanding. A formal time-out was conducted consistent with institution rules.  Monitors were applied, and minimal sedation used (see nursing record). The site was prepped with skin prep and allowed to dry, and sterile gloves were used. A high frequency linear ultrasound probe with probe cover was utilized throughout. C5-7 nerve roots located and appeared anatomically normal, local anesthetic injected around them, and echogenic block needle trajectory was monitored throughout. Aspiration performed every 37m. Lung and blood vessels were avoided. All injections were performed without resistance and free of blood and paresthesias. The patient tolerated the procedure well.  Injectate:  231mexparel + 1024m.5% bupivacaine

## 2021-10-23 NOTE — Anesthesia Postprocedure Evaluation (Signed)
Anesthesia Post Note  Patient: Jesse Jennings  Procedure(s) Performed: Left shoulder arthroscopic vs mini-open rotator cuff repair, biceps tenodesis, distal clavicle excision, subacromial decompression (Left)  Patient location during evaluation: PACU Anesthesia Type: General Level of consciousness: awake and alert, oriented and patient cooperative Pain management: pain level controlled Vital Signs Assessment: post-procedure vital signs reviewed and stable Respiratory status: spontaneous breathing, nonlabored ventilation and respiratory function stable Cardiovascular status: blood pressure returned to baseline and stable Postop Assessment: adequate PO intake Anesthetic complications: no   No notable events documented.   Last Vitals:  Vitals:   10/23/21 1630 10/23/21 1642  BP: (!) 138/96 (!) 132/98  Pulse: 84 68  Resp: 15 16  Temp: (!) 36.3 C (!) 36.2 C  SpO2: 96% 98%    Last Pain:  Vitals:   10/23/21 1642  TempSrc: Temporal  PainSc: 0-No pain                 Darrin Nipper

## 2021-10-23 NOTE — H&P (Signed)
Paper H&P to be scanned into permanent record. H&P reviewed. No significant changes noted.  

## 2021-10-23 NOTE — Op Note (Signed)
SURGERY DATE: 10/23/2021   PRE-OP DIAGNOSIS:  1. Left subacromial impingement 2. Left biceps tendinopathy 3. Left rotator cuff tear  POST-OP DIAGNOSIS: 1. Left subacromial impingement 2. Left biceps tendinopathy 3. Left rotator cuff tear (upper border subscapularis and full-thickness supraspinatus)  PROCEDURES:  1. Left arthroscopic rotator cuff repair (upper border subscapularis) 2. Left mini open rotator cuff repair (supraspinatus 3. Left arthroscopic biceps tenodesis 4. Left arthroscopic subacromial decompression 5. Left arthroscopic extensive debridement of shoulder (glenohumeral and subacromial spaces)  SURGEON: Cato Mulligan, MD   ASSISTANT: Evette Georges, PA-S    ANESTHESIA: Gen with Exparel interscalene block   ESTIMATED BLOOD LOSS: 5cc   DRAINS:  none   TOTAL IV FLUIDS: per anesthesia      SPECIMENS: none   IMPLANTS:  - Arthrex 2.69m PushLock x 1 - Arthrex 3.9 mm knotless corkscrew x1 - Arthrex 4.711mSwiveLock x 3 - Iconix SPEED double loaded with 1.2 and 2.27m19mape x 1     OPERATIVE FINDINGS:  Examination under anesthesia: A careful examination under anesthesia was performed.  Passive range of motion was: FF: 150; ER at side: 70; ER in abduction: 100; IR in abduction: 45.  Anterior load shift: NT.  Posterior load shift: NT.  Sulcus in neutral: NT.  Sulcus in ER: NT.     Intra-operative findings: A thorough arthroscopic examination of the shoulder was performed.  The findings are: 1. Biceps tendon: tendinopathy with significant erythema  2. Superior labrum: Type II SLAP tear and significant erythema 3. Posterior labrum and capsule: normal 4. Inferior capsule and inferior recess: normal 5. Glenoid cartilage surface: Grade 1 degenerative changes 6. Supraspinatus attachment: full-thickness tear of the supraspinatus 7. Posterior rotator cuff attachment: normal 8. Humeral head articular cartilage: normal 9. Rotator interval: significant synovitis 10:  Subscapularis tendon: Partial-thickness upper border tear  11. Anterior labrum: Mildly degenerative 12. IGHL: normal   OPERATIVE REPORT:    Indications for procedure:  Jesse Jennings a 61 20o. male with a history of left shoulder pain after he fell off of a ladder.  He has had difficulty with overhead motion since that time with sensations of weakness. Clinical exam and MRI were suggestive of full-thickness rotator cuff tear, biceps tendinopathy, and subacromial impingement. After discussion of risks, benefits, and alternatives to surgery, the patient elected to proceed.    Procedure in detail:   I identified RanJeanett Schlein the pre-operative holding area.  I marked the operative shoulder with my initials. I reviewed the risks and benefits of the proposed surgical intervention, and the patient wished to proceed.  Anesthesia was then performed with an Exparel interscalene block.  The patient was transferred to the operative suite and placed in the beach chair position.     Appropriate IV antibiotics were administered prior to incision. The operative upper extremity was then prepped and draped in standard fashion. A time out was performed confirming the correct extremity, correct patient, and correct procedure.    I then created a standard posterior portal with an 11 blade. The glenohumeral joint was easily entered with a blunt trocar and the arthroscope introduced. The findings of diagnostic arthroscopy are described above. I debrided degenerative tissue including the synovitic tissue about the rotator interval and anterior and superior labrum. I then coagulated the inflamed synovium to obtain hemostasis and reduce the risk of post-operative swelling using an Arthrocare radiofrequency device.   I then turned my attention to the arthroscopic biceps tenodesis. The Loop n Tack technique was used to  pass a FiberTape through the biceps in a locked fashion adjacent to the biceps anchor.  A hole for a  2.9 mm Arthrex PushLock was drilled in the bicipital groove just superior to the subscapularis tendon insertion.  The biceps tendon was then cut and the biceps anchor complex was debrided down to a stable base on the superior labrum.  The FiberTape was loaded onto the PushLock anchor and impacted into place into the previously drilled hole in the bicipital groove.  This appropriately secured the biceps into the bicipital groove and took it off of tension.   Next, arthroscopic repair of the subscapularis was performed. The lesser tuberosity footprint was prepared with a combination of electrocautery and an arthroscopic curette.  An Arthrex knotless corkscrew was placed into the lesser tuberosity footprint from the anterior portal.  A BirdBeak was used to shuttle the repair suture through the upper border of the subscapularis tendon.  The suture was then shuttled through the anchor. With the arm in neutral rotation, the repair was tensioned appropriately. This appropriately reduced the subscapularis tear.  The arm was then internally and externally rotated and the subscapularis was noted to move appropriately with rotation.  The remainder of the suture was then cut.   Next, the arthroscope was then introduced into the subacromial space. A direct lateral portal was created with an 11-blade after spinal needle localization. An extensive subacromial bursectomy and debridement was performed using a combination of the shaver and Arthrocare wand. The entire acromial undersurface was exposed and the CA ligament was subperiosteally elevated to expose the anterior acromial hook. A burr was used to create a flat anterior and lateral aspect of the acromion, converting it from a Type 2 to a Type 1 acromion. Care was made to keep the deltoid fascia intact.  This concluded the arthroscopic portion of the procedure.    A longitudinal incision from the anterolateral acromion ~6cm in length was made overlying the raphe between  the anterior and middle heads of the deltoid. The raphe was identified and it was incised. The subacromial space was identified. Any remaining bursa was excised. The rotator cuff tear was identified. It was an L-shaped tear with the long limb of the L anterior.   The rotator cuff footprint was cleared of soft tissue. A rongeur was used to gently decorticate the rotator cuff footprint to allow for improved healing.  An Iconix SPEED anchors was placed just lateral to the articular margin posteriorly.  And Arthrex SwiveLock anchor double loaded with tape was placed anteriorly.  The rotator cuff was mobilized using key elevators both superior and inferior to the tear. The rotator cuff was able to be easily reduced to its footprint and then held in a reduced position with graspers. All 8 strands of suture from the medial row anchors were passed through the rotator cuff in an appropriate fashion.  The FiberWire from the anterior SwiveLock anchor was passed in a side-to-side fashion with remnant anterior cuff with torn cuff.  This was then tied.  Next, Two SwiveLock anchors were placed for the lateral row anchors with one limb of each of the remaining two medial row sutures passed through each anchor.  The knotless mechanism from the anterior lateral row SwiveLock anchor was used to further reinforce the anterior cable.  This construct was stable with external and internal rotation and allowed for excellent reapproximation of the rotator cuff to its natural footprint.  The wound was thoroughly irrigated.  The deltoid split was closed  with 0 Vicryl.  The subdermal layer was closed with 2-0 Vicryl.  The skin was closed with staples. The portals were closed with 3-0 Nylon. Xeroform was applied to the incisions. A sterile dressing was applied, followed by a Polar Care sleeve and a SlingShot shoulder immobilizer/sling. The patient was awakened from anesthesia without difficulty and was transferred to the PACU in stable  condition.    COMPLICATIONS: none   DISPOSITION: plan for discharge home after recovery in PACU     POSTOPERATIVE PLAN: Remain in sling (except hygiene and elbow/wrist/hand RoM exercises as instructed by PT) x 6 weeks and NWB for this time. PT to begin 3-4 days after surgery.  Large rotator cuff repair rehab protocol + subscapularis restrictions. ASA '325mg'$  daily x 2 weeks for DVT ppx.

## 2021-10-24 ENCOUNTER — Encounter: Payer: Self-pay | Admitting: Orthopedic Surgery

## 2022-06-06 ENCOUNTER — Ambulatory Visit
Admission: RE | Admit: 2022-06-06 | Discharge: 2022-06-06 | Disposition: A | Discharge status: Home / Self Care | Payer: Managed Care, Other (non HMO) | Source: Ambulatory Visit | Attending: Family Medicine | Admitting: Family Medicine

## 2022-06-06 ENCOUNTER — Other Ambulatory Visit: Payer: Self-pay | Admitting: Family Medicine

## 2022-06-06 DIAGNOSIS — R1032 Left lower quadrant pain: Secondary | ICD-10-CM

## 2022-06-06 MED ORDER — IOPAMIDOL (ISOVUE-300) INJECTION 61%
100.0000 mL | Freq: Once | INTRAVENOUS | Status: AC | PRN
  Administered 2022-06-06: 100 mL via INTRAVENOUS

## 2024-01-02 ENCOUNTER — Ambulatory Visit: Payer: Self-pay

## 2024-01-02 DIAGNOSIS — Z09 Encounter for follow-up examination after completed treatment for conditions other than malignant neoplasm: Secondary | ICD-10-CM | POA: Diagnosis present

## 2024-01-02 DIAGNOSIS — Z860101 Personal history of adenomatous and serrated colon polyps: Secondary | ICD-10-CM | POA: Diagnosis not present

## 2024-01-02 DIAGNOSIS — K621 Rectal polyp: Secondary | ICD-10-CM | POA: Diagnosis not present

## 2024-01-02 DIAGNOSIS — K5730 Diverticulosis of large intestine without perforation or abscess without bleeding: Secondary | ICD-10-CM | POA: Diagnosis not present
